# Patient Record
Sex: Male | Born: 1960 | Race: White | Hispanic: No | Marital: Single | Smoking: Never smoker
Health system: Southern US, Community
[De-identification: ages and names within clinical notes are randomized; demographics above are authoritative.]

## PROBLEM LIST (undated history)

## (undated) HISTORY — PX: SHOULDER SURGERY: SHX246

---

## 2014-04-10 ENCOUNTER — Encounter (HOSPITAL_COMMUNITY): Payer: Self-pay | Admitting: Emergency Medicine

## 2014-04-10 ENCOUNTER — Emergency Department (HOSPITAL_COMMUNITY)
Admission: EM | Admit: 2014-04-10 | Discharge: 2014-04-10 | Disposition: A | Discharge status: Home / Self Care | Payer: Worker's Compensation | Attending: Emergency Medicine | Admitting: Emergency Medicine

## 2014-04-10 ENCOUNTER — Emergency Department (HOSPITAL_COMMUNITY): Payer: Worker's Compensation

## 2014-04-10 DIAGNOSIS — S5292XA Unspecified fracture of left forearm, initial encounter for closed fracture: Secondary | ICD-10-CM

## 2014-04-10 DIAGNOSIS — S52202A Unspecified fracture of shaft of left ulna, initial encounter for closed fracture: Secondary | ICD-10-CM

## 2014-04-10 DIAGNOSIS — S5010XA Contusion of unspecified forearm, initial encounter: Secondary | ICD-10-CM | POA: Insufficient documentation

## 2014-04-10 DIAGNOSIS — X500XXA Overexertion from strenuous movement or load, initial encounter: Secondary | ICD-10-CM | POA: Insufficient documentation

## 2014-04-10 DIAGNOSIS — Y9389 Activity, other specified: Secondary | ICD-10-CM | POA: Insufficient documentation

## 2014-04-10 DIAGNOSIS — S59919A Unspecified injury of unspecified forearm, initial encounter: Secondary | ICD-10-CM

## 2014-04-10 DIAGNOSIS — S59909A Unspecified injury of unspecified elbow, initial encounter: Secondary | ICD-10-CM | POA: Insufficient documentation

## 2014-04-10 DIAGNOSIS — Y9289 Other specified places as the place of occurrence of the external cause: Secondary | ICD-10-CM | POA: Insufficient documentation

## 2014-04-10 DIAGNOSIS — S5290XA Unspecified fracture of unspecified forearm, initial encounter for closed fracture: Secondary | ICD-10-CM | POA: Diagnosis not present

## 2014-04-10 DIAGNOSIS — S6990XA Unspecified injury of unspecified wrist, hand and finger(s), initial encounter: Secondary | ICD-10-CM

## 2014-04-10 MED ORDER — MORPHINE SULFATE 4 MG/ML IJ SOLN
8.0000 mg | Freq: Once | INTRAMUSCULAR | Status: AC
  Administered 2014-04-10: 8 mg via INTRAMUSCULAR
  Filled 2014-04-10: qty 2

## 2014-04-10 MED ORDER — OXYCODONE-ACETAMINOPHEN 5-325 MG PO TABS: 1.0000 | ORAL_TABLET | Freq: Four times a day (QID) | ORAL | Status: AC | PRN

## 2014-04-10 MED ORDER — NAPROXEN 500 MG PO TABS: 500.0000 mg | ORAL_TABLET | Freq: Two times a day (BID) | ORAL | Status: AC | PRN

## 2014-04-10 NOTE — ED Notes (Signed)
Pt was drilling in the floor drill bit stopped abruptly and pts left arm twisted on around. Pt c/o left arm pain worse with movement.

## 2014-04-10 NOTE — ED Provider Notes (Signed)
CSN: [id number]     Arrival date & time 04/10/14  1617 History  This chart was scribed for non-physician practitioner, Allen Derry PA-C working with Mirian Mo, MD, by Andrew Au, ED Scribe. This patient was seen in room WTR9/WTR9 and the patient's care was started at 6:23 PM.   Chief Complaint  Patient presents with  . Arm Injury   Patient is a 53 y.o. male presenting with arm injury. The history is provided by the patient. No language interpreter was used.  Arm Injury Location:  Arm Time since incident:  3 hours Injury: yes   Mechanism of injury comment:  Twisting Arm location:  L forearm Pain details:    Quality:  Aching and dull (stabbing)   Radiates to:  Does not radiate   Severity:  Moderate (7/10)   Onset quality:  Sudden   Duration:  3 hours   Timing:  Constant   Progression:  Unchanged Chronicity:  New Dislocation: no   Foreign body present:  No foreign bodies Prior injury to area:  No Relieved by:  None tried Worsened by:  Movement Ineffective treatments:  None tried Associated symptoms: decreased range of motion   Associated symptoms: no back pain, no muscle weakness, no neck pain, no numbness, no swelling and no tingling   Risk factors: no frequent fractures    Angel Perez is a 53 y.o. male who presents to the Emergency Department complaining of left arm injury involving left forearm onset 3 hours. Pt states while using a drill to the floor, drill bit stopped abruptly but arm kept twisting. Pt now has constant, 7/10, non radiating aching, stabbing, dull left forearm pain. Pt reports worsening pain with movement such as pronating left arm and bending elbow/wrist. Pt denies taking medication for the pain or applying ice. Pt denies numbness, tingling and weakness in the left arm. He denies elbow pain. Pt denies medical problems. Pt drove himself to be seen.   History reviewed. No pertinent past medical history. Past Surgical History  Procedure Laterality  Date  . Shoulder surgery     No family history on file. History  Substance Use Topics  . Smoking status: Never Smoker   . Smokeless tobacco: Not on file  . Alcohol Use: No    Review of Systems  Musculoskeletal: Positive for arthralgias and myalgias. Negative for back pain and neck pain.  Skin: Negative for color change and wound.  Neurological: Negative for weakness and numbness.  Hematological: Does not bruise/bleed easily.  10 Systems reviewed and all are negative for acute change except as noted in the HPI.   Allergies  Review of patient's allergies indicates no known allergies.  Home Medications   Prior to Admission medications   Not on File   BP 150/88  Pulse 57  Temp(Src) 97.9 F (36.6 C) (Oral)  Resp 19  SpO2 100% Physical Exam  Nursing note and vitals reviewed. Constitutional: He is oriented to person, place, and time. Vital signs are normal. He appears well-developed and well-nourished. No distress.  VSS  HENT:  Head: Normocephalic and atraumatic.  Mouth/Throat: Mucous membranes are normal.  Eyes: Conjunctivae and EOM are normal. Right eye exhibits no discharge. Left eye exhibits no discharge.  Neck: Normal range of motion. Neck supple.  Cardiovascular: Normal rate and intact distal pulses.   Distal pulses intact, cap refill brisk and present  Pulmonary/Chest: Effort normal. No respiratory distress.  Abdominal: He exhibits no distension.  Musculoskeletal:       Left elbow:  Normal. He exhibits normal range of motion and no swelling. No tenderness found.       Left wrist: He exhibits decreased range of motion (due to pain). He exhibits no tenderness, no swelling and no deformity.       Left forearm: He exhibits bony tenderness. He exhibits no deformity.       Arms: L forearm with tenderness to palpation along ulna and radius, no wrist or elbow TTP. Minimal bruising and no swelling noted, no bony crepitus or gross deformity noted. Loss of range of motion with  pronation as well as wrist flexion and extension secondary to pain. Able to move all digits fully, sensation grossly intact in all digits and portions of extremity, strength limited secondary to pain but 4/5 finger/thumb opposition strength and grip strength. Cap refill and distal pulses intact in all extremities.   Neurological: He is alert and oriented to person, place, and time. He has normal strength. No sensory deficit.  Skin: Skin is warm, dry and intact. Bruising noted.  Mild bruising to L forearm  Psychiatric: He has a normal mood and affect. His behavior is normal.    ED Course  Procedures (including critical care time) DIAGNOSTIC STUDIES: Oxygen Saturation is 100% on RA, normal by my interpretation.    COORDINATION OF CARE: 6:31 PM- Pt advised of plan for treatment which include pain medication and a brace and pt agrees. Pt is to ice, elevate and rest left arm. Pt has been advised to return if experiencing loss of sensation in fingers.  Labs Review Labs Reviewed - No data to display  Imaging Review Dg Forearm Left  04/10/2014   CLINICAL DATA:  Pain post trauma  EXAM: LEFT FOREARM - 2 VIEW  COMPARISON:  None.  FINDINGS: Frontal and lateral views were obtained. There is a spiral fracture of the distal ulnar diaphysis in near anatomic alignment. There is an incomplete fracture at the junction of the proximal and mid thirds of the radial diaphysis in anatomic alignment. No other fractures. No dislocation. Joint spaces appear intact.  IMPRESSION: Spiral fracture distal ulnar diaphysis in near anatomic alignment. Incomplete fracture junction of proximal and mid thirds of radial diaphysis. No dislocation.   Electronically Signed   By: Bretta Bang M.D.   On: 04/10/2014 18:21     EKG Interpretation None      MDM   Final diagnoses:  Radial fracture, left, closed, initial encounter  Ulnar fracture, left, closed, initial encounter   53y/o male with L arm pain after having a  torque injury from a drill. X-rays obtained and showing incomplete radial fracture and spiral distal ulnar fracture. No dislocation or neurovascular damage, full range of motion with no compartment syndrome symptoms or signs. Will place in posterior long arm and forearm sugar tong splint for full immobilization of all forearm range of motion. Will place and sling immobilizer, but discussed removal of this sling 4 times per day with gentle range of motion to the shoulder to ensure no shoulder stiffness occurs. Given morphine here for pain control, will have pt call a ride and have his car picked up. Discussed pain medication and followup with a hand specialist for ongoing management of his fracture. Discussed RICE therapy to help with swelling and pain. I explained the diagnosis and have given explicit precautions to return to the ER including for any other new or worsening symptoms. The patient understands and accepts the medical plan as it's been dictated and I have answered their questions.  Discharge instructions concerning home care and prescriptions have been given. The patient is STABLE and is discharged to home in good condition.   I personally performed the services described in this documentation, which was scribed in my presence. The recorded information has been reviewed and is accurate.  BP 150/88  Pulse 57  Temp(Src) 97.9 F (36.6 C) (Oral)  Resp 19  SpO2 100%  Meds ordered this encounter  Medications  . morphine 4 MG/ML injection 8 mg    Sig:   . naproxen (NAPROSYN) 500 MG tablet    Sig: Take 1 tablet (500 mg total) by mouth 2 (two) times daily as needed for mild pain, moderate pain or headache (TAKE WITH MEALS.).    Dispense:  20 tablet    Refill:  0    Order Specific Question:  Supervising Provider    Answer:  Eber Hong D [[id number]]  . oxyCODONE-acetaminophen (PERCOCET) 5-325 MG per tablet    Sig: Take 1-2 tablets by mouth every 6 (six) hours as needed for severe pain.     Dispense:  20 tablet    Refill:  0    Order Specific Question:  Supervising Provider    Answer:  Vida Roller [address]       [place] Camprubi-Soms, PA-C 04/10/14 1910

## 2014-04-10 NOTE — Discharge Instructions (Signed)
Wear arm splint at all times until you've seen the hand specialist, and wear the sling to help support your arm. Take the splint off 4 times daily and perform gentle shoulder range of motion to help avoid your shoulder from becoming stiff. Ice and elevate arm throughout the day. Alternate between naprosyn and percocet for pain relief. Do not drive or operate machinery with pain medication use. Call orthopedic follow up today or tomorrow to schedule followup appointment in 5-7 days for ongoing management of your arm fracture. If your fingers feel numb or tingly, unwrap the ace wrap and loosen it slightly, but if this does not relieve the symptoms then come to the ER immediately. Return to the ER for changes or worsening symptoms.   Forearm Fracture Your caregiver has diagnosed you as having a broken bone (fracture) of the forearm. This is the part of your arm between the elbow and your wrist. Your forearm is made up of two bones. These are the radius and ulna. A fracture is a break in one or both bones. A cast or splint is used to protect and keep your injured bone from moving. The cast or splint will be on generally for about 5 to 6 weeks, with individual variations. HOME CARE INSTRUCTIONS   Keep the injured part elevated while sitting or lying down. Keeping the injury above the level of your heart (the center of the chest). This will decrease swelling and pain.  Apply ice to the injury for 15-20 minutes, 03-04 times per day while awake, for 2 days. Put the ice in a plastic bag and place a thin towel between the bag of ice and your cast or splint.  If you have a plaster or fiberglass cast:  Do not try to scratch the skin under the cast using sharp or pointed objects.  Check the skin around the cast every day. You may put lotion on any red or sore areas.  Keep your cast dry and clean.  If you have a plaster splint:  Wear the splint as directed.  You may loosen the elastic around the splint if  your fingers become numb, tingle, or turn cold or blue.  Do not put pressure on any part of your cast or splint. It may break. Rest your cast only on a pillow the first 24 hours until it is fully hardened.  Your cast or splint can be protected during bathing with a plastic bag. Do not lower the cast or splint into water.  Only take over-the-counter or prescription medicines for pain, discomfort, or fever as directed by your caregiver. SEEK IMMEDIATE MEDICAL CARE IF:   Your cast gets damaged or breaks.  You have more severe pain or swelling than you did before the cast.  Your skin or nails below the injury turn blue or gray, or feel cold or numb.  There is a bad smell or new stains and/or pus like (purulent) drainage coming from under the cast. MAKE SURE YOU:   Understand these instructions.  Will watch your condition.  Will get help right away if you are not doing well or get worse. Document Released: 06/27/2000 Document Revised: 09/22/2011 Document Reviewed: 02/17/2008 [facility] Patient Information 2015 [place], [place]. This information is not intended to replace advice given to you by your health care provider. Make sure you discuss any questions you have with your health care provider.   Ulnar Fracture You have a fracture (broken bone) of the forearm. This is the part of your  arm between the elbow and your wrist. Your forearm is made up of two bones. These are the radius and ulna. Your fracture is in the ulna. This is the bone in your forearm located on the little finger side of your forearm. A cast or splint is used to protect and keep your injured bone from moving. The cast or splint will be on generally for about 5 to 6 weeks, with individual variations. HOME CARE INSTRUCTIONS   Keep the injured part elevated while sitting or lying down. Keep the injury above the level of your heart (the center of the chest). This will decrease swelling and pain.  Apply ice to the injury for  15-20 minutes, 03-04 times per day while awake, for 2 days. Put the ice in a plastic bag and place a towel between the bag of ice and your cast or splint.  Move your fingers to avoid stiffness and minimize swelling.  If you have a plaster or fiberglass cast:  Do not try to scratch the skin under the cast using sharp or pointed objects.  Check the skin around the cast every day. You may put lotion on any red or sore areas.  Keep your cast dry and clean.  If you have a plaster splint:  Wear the splint as directed.  You may loosen the elastic around the splint if your fingers become numb, tingle, or turn cold or blue.  Do not put pressure on any part of your cast or splint. It may break. Rest your cast only on a pillow the first 24 hours until it is fully hardened.  Your cast or splint can be protected during bathing with a plastic bag. Do not lower the cast or splint into water.  Only take over-the-counter or prescription medicines for pain, discomfort, or fever as directed by your caregiver. SEEK IMMEDIATE MEDICAL CARE IF:   Your cast gets damaged or breaks.  You have more severe pain or swelling than you did before the cast.  You have severe pain when stretching your fingers.  There is a bad smell or new stains and/or purulent (pus like) drainage coming from under the cast. Document Released: 12/11/2005 Document Revised: 09/22/2011 Document Reviewed: 05/15/2007 [employer] Patient Information 2015 [place], [place]. This information is not intended to replace advice given to you by your health care provider. Make sure you discuss any questions you have with your health care provider.  Radial Fracture You have a broken bone (fracture) of the forearm. This is the part of your arm between the elbow and your wrist. Your forearm is made up of two bones. These are the radius and ulna. Your fracture is in the radial shaft. This is the bone in your forearm located on the thumb side. A cast or  splint is used to protect and keep your injured bone from moving. The cast or splint will be on generally for about 5 to 6 weeks, with individual variations. HOME CARE INSTRUCTIONS   Keep the injured part elevated while sitting or lying down. Keep the injury above the level of your heart (the center of the chest). This will decrease swelling and pain.  Apply ice to the injury for 15-20 minutes, 03-04 times per day while awake, for 2 days. Put the ice in a plastic bag and place a towel between the bag of ice and your cast or splint.  Move your fingers to avoid stiffness and minimize swelling.  If you have a plaster or fiberglass cast:  Do not try to scratch the skin under the cast using sharp or pointed objects.  Check the skin around the cast every day. You may put lotion on any red or sore areas.  Keep your cast dry and clean.  If you have a plaster splint:  Wear the splint as directed.  You may loosen the elastic around the splint if your fingers become numb, tingle, or turn cold or blue.  Do not put pressure on any part of your cast or splint. It may break. Rest your cast only on a pillow for the first 24 hours until it is fully hardened.  Your cast or splint can be protected during bathing with a plastic bag. Do not lower the cast or splint into water.  Only take over-the-counter or prescription medicines for pain, discomfort, or fever as directed by your caregiver. SEEK IMMEDIATE MEDICAL CARE IF:   Your cast gets damaged or breaks.  You have more severe pain or swelling than you did before getting the cast.  You have severe pain when stretching your fingers.  There is a bad smell, new stains and/or pus-like (purulent) drainage coming from under the cast.  Your fingers or hand turn pale or blue and become cold or your loose feeling. Document Released: 12/11/2005 Document Revised: 09/22/2011 Document Reviewed: 03/09/2006 [facility] Patient Information 2015 [place], [place].  This information is not intended to replace advice given to you by your health care provider. Make sure you discuss any questions you have with your health care provider. Cast or Splint Care Casts and splints support injured limbs and keep bones from moving while they heal.  HOME CARE  Keep the cast or splint uncovered during the drying period.  A plaster cast can take 24 to 48 hours to dry.  A fiberglass cast will dry in less than 1 hour.  Do not rest the cast on anything harder than a pillow for 24 hours.  Do not put weight on your injured limb. Do not put pressure on the cast. Wait for your doctor's approval.  Keep the cast or splint dry.  Cover the cast or splint with a plastic bag during baths or wet weather.  If you have a cast over your chest and belly (trunk), take sponge baths until the cast is taken off.  If your cast gets wet, dry it with a towel or blow dryer. Use the cool setting on the blow dryer.  Keep your cast or splint clean. Wash a dirty cast with a damp cloth.  Do not put any objects under your cast or splint.  Do not scratch the skin under the cast with an object. If itching is a problem, use a blow dryer on a cool setting over the itchy area.  Do not trim or cut your cast.  Do not take out the padding from inside your cast.  Exercise your joints near the cast as told by your doctor.  Raise (elevate) your injured limb on 1 or 2 pillows for the first 1 to 3 days. GET HELP IF:  Your cast or splint cracks.  Your cast or splint is too tight or too loose.  You itch badly under the cast.  Your cast gets wet or has a soft spot.  You have a bad smell coming from the cast.  You get an object stuck under the cast.  Your skin around the cast becomes red or sore.  You have new or more pain after the cast is put  on. GET HELP RIGHT AWAY IF:  You have fluid leaking through the cast.  You cannot move your fingers or toes.  Your fingers or toes turn blue  or white or are cool, painful, or puffy (swollen).  You have tingling or lose feeling (numbness) around the injured area.  You have bad pain or pressure under the cast.  You have trouble breathing or have shortness of breath.  You have chest pain. Document Released: 10/30/2010 Document Revised: 03/02/2013 Document Reviewed: 01/06/2013 [facility] Patient Information 2015 [place], [place]. This information is not intended to replace advice given to you by your health care provider. Make sure you discuss any questions you have with your health care provider.

## 2014-04-12 NOTE — ED Provider Notes (Signed)
Medical screening examination/treatment/procedure(s) were performed by non-physician practitioner and as supervising physician I was immediately available for consultation/collaboration.   EKG Interpretation None        Matthew Gentry, MD 04/12/14 1121 

## 2014-04-18 ENCOUNTER — Emergency Department (HOSPITAL_COMMUNITY)
Admission: EM | Admit: 2014-04-18 | Discharge: 2014-04-18 | Disposition: A | Discharge status: Home / Self Care | Payer: Worker's Compensation | Attending: Emergency Medicine | Admitting: Emergency Medicine

## 2014-04-18 ENCOUNTER — Encounter (HOSPITAL_COMMUNITY): Payer: Self-pay | Admitting: Emergency Medicine

## 2014-04-18 DIAGNOSIS — R202 Paresthesia of skin: Secondary | ICD-10-CM | POA: Diagnosis not present

## 2014-04-18 DIAGNOSIS — Z4789 Encounter for other orthopedic aftercare: Secondary | ICD-10-CM

## 2014-04-18 DIAGNOSIS — R2232 Localized swelling, mass and lump, left upper limb: Secondary | ICD-10-CM | POA: Diagnosis not present

## 2014-04-18 DIAGNOSIS — Z4689 Encounter for fitting and adjustment of other specified devices: Secondary | ICD-10-CM | POA: Diagnosis not present

## 2014-04-18 NOTE — ED Notes (Signed)
Bed: WTR7 Expected date:  Expected time:  Means of arrival:  Comments: blood

## 2014-04-18 NOTE — ED Notes (Signed)
Pt is able to move fingers freely without difficulty. Neurological status intact on left upper extremity. Pt states arm feels tight from forearm radiating to upper arm. No bruising, bleeding, or swelling noted at this time. Pt is calm at this time. Will continue to monitor. Awaiting orders from provider.

## 2014-04-18 NOTE — ED Provider Notes (Signed)
CSN: [id number]     Arrival date & time 04/18/14  2050 History  This chart was scribed for non-physician practitioner, Emilia BeckKaitlyn Kahley Leib, PA-C working with Lyanne CoKevin M Campos, MD by Freida Busmaniana Omoyeni, ED Scribe. This patient was seen in room WTR7/WTR7 and the patient's care was started at 9:42 PM.    Chief Complaint  Patient presents with  . post-surgury cast tightness     The history is provided by the patient. No language interpreter was used.   HPI Comments:  Angel Perez is a 53 y.o. male who had orthopedic surgery to his left arm yesterday. He presents to the Emergency Department complaining of tingling to the fingers of his left hand and states he noticed his fingers turing blue this evening. He believes his cast may be too tight. He states he tries to keep LUE elevated but is not always compliant with elevation and wearing his sling. No alleviating factors noted.   History reviewed. No pertinent past medical history. Past Surgical History  Procedure Laterality Date  . Shoulder surgery     No family history on file. History  Substance Use Topics  . Smoking status: Never Smoker   . Smokeless tobacco: Not on file  . Alcohol Use: No    Review of Systems  Skin:       Paresthesia   All other systems reviewed and are negative.     Allergies  Review of patient's allergies indicates no known allergies.  Home Medications   Prior to Admission medications   Medication Sig Start Date End Date Taking? Authorizing Provider  naproxen (NAPROSYN) 500 MG tablet Take 1 tablet (500 mg total) by mouth 2 (two) times daily as needed for mild pain, moderate pain or headache (TAKE WITH MEALS.). 04/10/14   Mercedes Strupp Camprubi-Soms, PA-C  oxyCODONE-acetaminophen (PERCOCET) 5-325 MG per tablet Take 1-2 tablets by mouth every 6 (six) hours as needed for severe pain. 04/10/14   Mercedes Strupp Camprubi-Soms, PA-C   BP 130/86  Pulse 60  Temp(Src) 98 F (36.7 C) (Oral)  Resp 20  SpO2  98% Physical Exam  Nursing note and vitals reviewed. Constitutional: He appears well-developed and well-nourished. No distress.  HENT:  Head: Normocephalic and atraumatic.  Eyes: Conjunctivae are normal.  Neck: Normal range of motion.  Cardiovascular: Normal rate.   Pulmonary/Chest: Effort normal.  Musculoskeletal: Normal range of motion.  Mild edema noted to left fingers     Neurological: He is alert.  Sensation intact  Skin: Skin is warm and dry.  Normal coloring of skin     ED Course  Procedures   DIAGNOSTIC STUDIES:  Oxygen Saturation is 98% on RA, normal by my interpretation.    COORDINATION OF CARE:  9:47 PM Discussed treatment plan with pt at bedside and pt agreed to plan.  Labs Review Labs Reviewed - No data to display  Imaging Review No results found.   EKG Interpretation None      MDM   Final diagnoses:  Cast discomfort    Patient's splint fit appropriately. Patient has sufficient circulation and sensation to left hand fingers. Patient instructed to follow up with surgeon as needed.   I personally performed the services described in this documentation, which was scribed in my presence. The recorded information has been reviewed and is accurate.    Emilia BeckKaitlyn Talbert Trembath, PA-C 04/19/14 0028

## 2014-04-18 NOTE — ED Notes (Signed)
Pt states had L arm surgery yesterday, states was eating dinner tonight and felt like fingers looked blue, states color looks better now, capillary refill normal, pt states cast does feel tight.

## 2014-04-18 NOTE — Discharge Instructions (Signed)
Rest, ice and elevate your left arm. Follow up with Dr. Mina MarbleWeingold as needed.

## 2014-04-19 NOTE — ED Provider Notes (Signed)
Medical screening examination/treatment/procedure(s) were performed by non-physician practitioner and as supervising physician I was immediately available for consultation/collaboration.   EKG Interpretation None        Keyly Baldonado M Frayda Egley, MD 04/19/14 0100 

## 2015-09-29 IMAGING — CR DG FOREARM 2V*L*
2 series · 2 of 2 positions shown · non-contrast
Comparison: None.

CLINICAL DATA: Pain post trauma

EXAM:
LEFT FOREARM - 2 VIEW

[x forearm ap left]
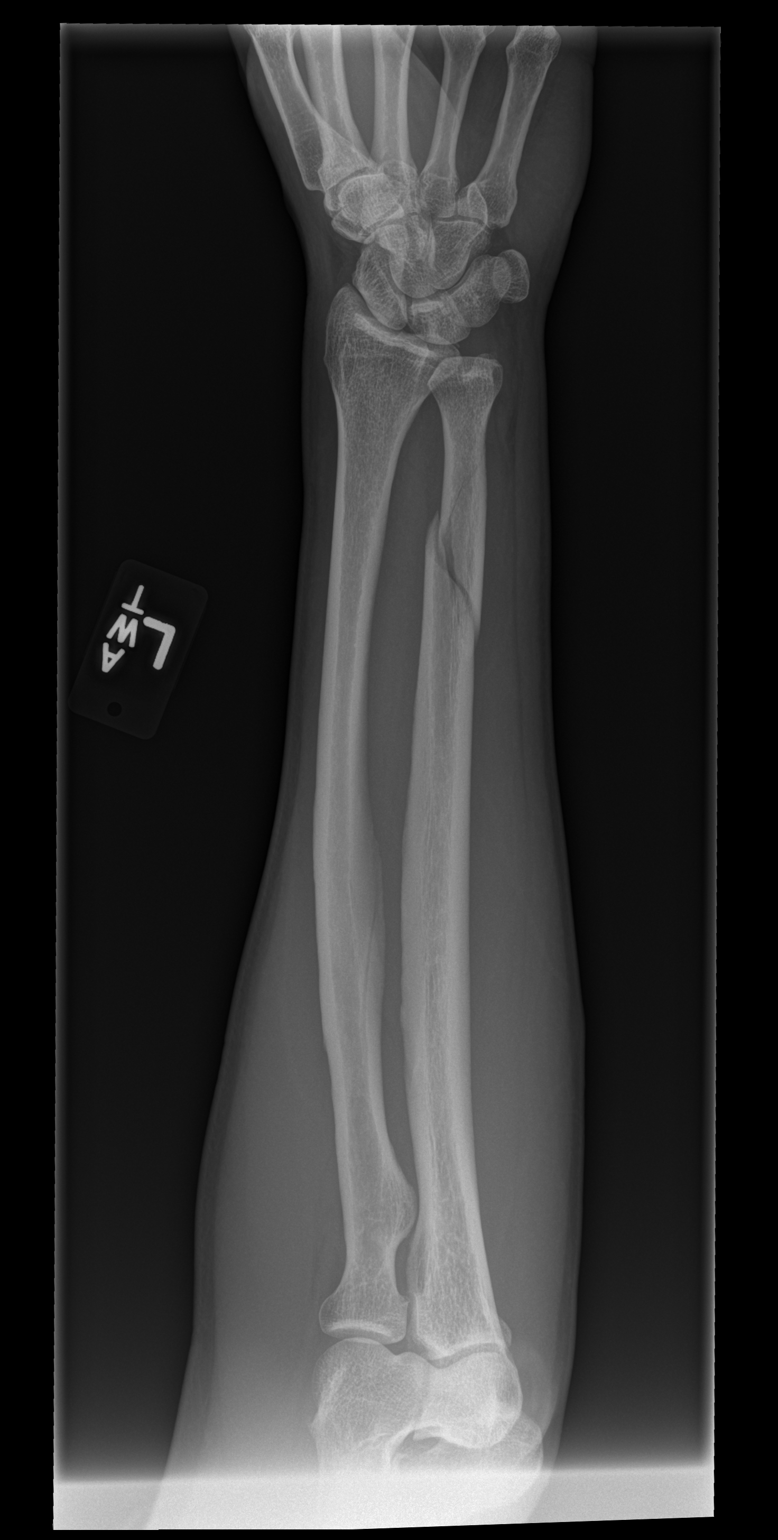

[x forearm lat left]
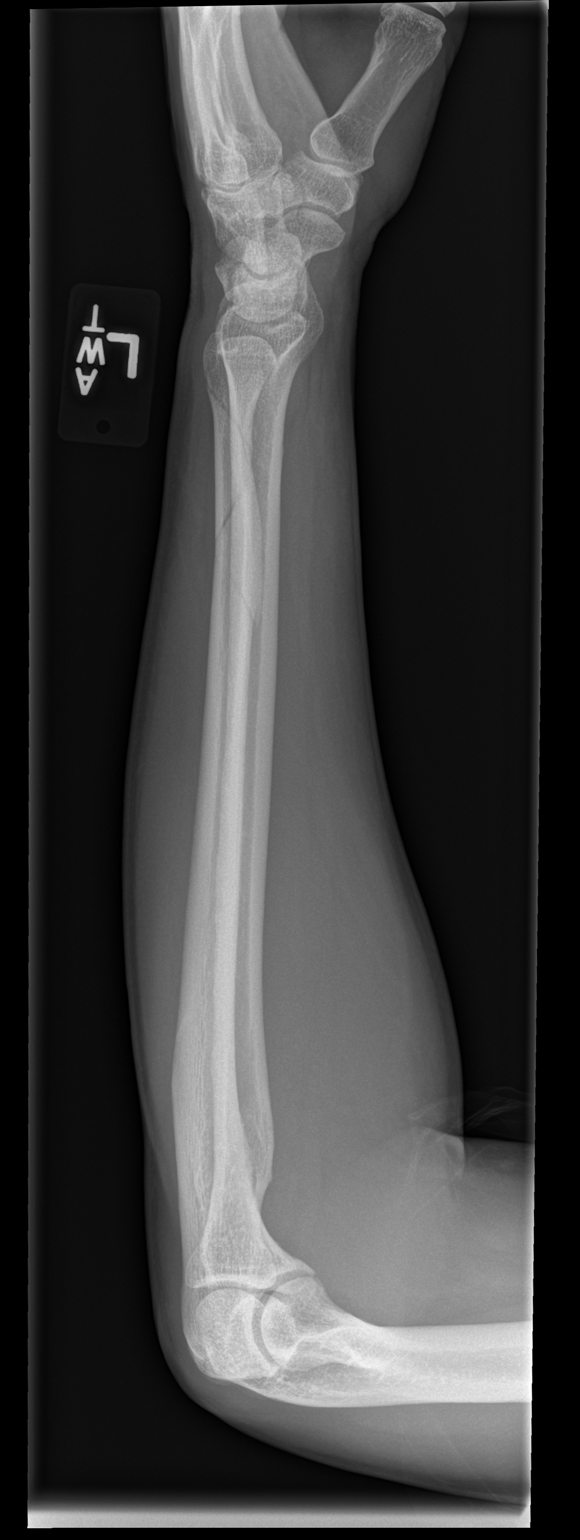

[2 of 2 positions shown; findings below may reference images not displayed]

FINDINGS: Frontal and lateral views were obtained. There is a spiral fracture
of the distal ulnar diaphysis in near anatomic alignment. There is
an incomplete fracture at the junction of the proximal and mid
thirds of the radial diaphysis in anatomic alignment. No other
fractures. No dislocation. Joint spaces appear intact.
IMPRESSION: Spiral fracture distal ulnar diaphysis in near anatomic alignment.
Incomplete fracture junction of proximal and mid thirds of radial
diaphysis. No dislocation.

## 2021-02-18 ENCOUNTER — Emergency Department (HOSPITAL_COMMUNITY)
Admission: EM | Admit: 2021-02-18 | Discharge: 2021-02-18 | Disposition: A | Discharge status: Home / Self Care | Payer: Self-pay | Attending: Emergency Medicine | Admitting: Emergency Medicine

## 2021-02-18 ENCOUNTER — Encounter (HOSPITAL_COMMUNITY): Payer: Self-pay | Admitting: Emergency Medicine

## 2021-02-18 ENCOUNTER — Other Ambulatory Visit: Payer: Self-pay

## 2021-02-18 DIAGNOSIS — R519 Headache, unspecified: Secondary | ICD-10-CM | POA: Insufficient documentation

## 2021-02-18 DIAGNOSIS — R0981 Nasal congestion: Secondary | ICD-10-CM | POA: Insufficient documentation

## 2021-02-18 DIAGNOSIS — Z5321 Procedure and treatment not carried out due to patient leaving prior to being seen by health care provider: Secondary | ICD-10-CM | POA: Insufficient documentation

## 2021-02-18 NOTE — ED Notes (Signed)
Pt called 2x no answer 

## 2021-02-18 NOTE — ED Triage Notes (Signed)
Patient reports increasing sinus pressure/nasal congestion and frontal headache this week , no cough or fever .
